# Patient Record
Sex: Male | Born: 1995 | Hispanic: Yes | Marital: Single | Smoking: Current every day smoker
Health system: Southern US, Community
[De-identification: ages and names within clinical notes are randomized; demographics above are authoritative.]

## PROBLEM LIST (undated history)

## (undated) DIAGNOSIS — J45909 Unspecified asthma, uncomplicated: Secondary | ICD-10-CM

---

## 2017-01-29 ENCOUNTER — Encounter: Payer: Self-pay | Admitting: Emergency Medicine

## 2017-01-29 ENCOUNTER — Emergency Department
Admission: EM | Admit: 2017-01-29 | Discharge: 2017-01-29 | Disposition: A | Discharge status: Home / Self Care | Payer: Self-pay | Attending: Emergency Medicine | Admitting: Emergency Medicine

## 2017-01-29 ENCOUNTER — Emergency Department: Payer: Self-pay

## 2017-01-29 DIAGNOSIS — Y999 Unspecified external cause status: Secondary | ICD-10-CM | POA: Insufficient documentation

## 2017-01-29 DIAGNOSIS — Y939 Activity, unspecified: Secondary | ICD-10-CM | POA: Insufficient documentation

## 2017-01-29 DIAGNOSIS — J45909 Unspecified asthma, uncomplicated: Secondary | ICD-10-CM | POA: Insufficient documentation

## 2017-01-29 DIAGNOSIS — R1084 Generalized abdominal pain: Secondary | ICD-10-CM | POA: Insufficient documentation

## 2017-01-29 DIAGNOSIS — F17210 Nicotine dependence, cigarettes, uncomplicated: Secondary | ICD-10-CM | POA: Insufficient documentation

## 2017-01-29 DIAGNOSIS — Y929 Unspecified place or not applicable: Secondary | ICD-10-CM | POA: Insufficient documentation

## 2017-01-29 HISTORY — DX: Unspecified asthma, uncomplicated: J45.909

## 2017-01-29 LAB — CBC WITH DIFFERENTIAL/PLATELET
BASOS PCT: 1 %
Basophils Absolute: 0.1 10*3/uL (ref 0–0.1)
Eosinophils Absolute: 0.1 10*3/uL (ref 0–0.7)
Eosinophils Relative: 1 %
HEMATOCRIT: 49.2 % (ref 40.0–52.0)
HEMOGLOBIN: 16.6 g/dL (ref 13.0–18.0)
LYMPHS ABS: 4 10*3/uL — AB (ref 1.0–3.6)
Lymphocytes Relative: 26 %
MCH: 28.6 pg (ref 26.0–34.0)
MCHC: 33.9 g/dL (ref 32.0–36.0)
MCV: 84.6 fL (ref 80.0–100.0)
MONO ABS: 1.2 10*3/uL — AB (ref 0.2–1.0)
MONOS PCT: 8 %
NEUTROS ABS: 10 10*3/uL — AB (ref 1.4–6.5)
Neutrophils Relative %: 64 %
Platelets: 286 10*3/uL (ref 150–440)
RBC: 5.81 MIL/uL (ref 4.40–5.90)
RDW: 12.7 % (ref 11.5–14.5)
WBC: 15.4 10*3/uL — ABNORMAL HIGH (ref 3.8–10.6)

## 2017-01-29 LAB — COMPREHENSIVE METABOLIC PANEL
ALBUMIN: 5.3 g/dL — AB (ref 3.5–5.0)
ALK PHOS: 70 U/L (ref 38–126)
ALT: 19 U/L (ref 17–63)
ANION GAP: 10 (ref 5–15)
AST: 27 U/L (ref 15–41)
BUN: 9 mg/dL (ref 6–20)
CALCIUM: 9.8 mg/dL (ref 8.9–10.3)
CHLORIDE: 104 mmol/L (ref 101–111)
CO2: 25 mmol/L (ref 22–32)
Creatinine, Ser: 0.81 mg/dL (ref 0.61–1.24)
GFR calc Af Amer: >60 mL/min (ref >60)
GFR calc non Af Amer: >60 mL/min (ref >60)
GLUCOSE: 100 mg/dL — AB (ref 65–99)
Potassium: 3.4 mmol/L — ABNORMAL LOW (ref 3.5–5.1)
SODIUM: 139 mmol/L (ref 135–145)
Total Bilirubin: 0.9 mg/dL (ref 0.3–1.2)
Total Protein: 8.2 g/dL — ABNORMAL HIGH (ref 6.5–8.1)

## 2017-01-29 LAB — URINALYSIS, COMPLETE (UACMP) WITH MICROSCOPIC
BILIRUBIN URINE: NEGATIVE
Bacteria, UA: NONE SEEN
Glucose, UA: NEGATIVE mg/dL
Hgb urine dipstick: NEGATIVE
Ketones, ur: NEGATIVE mg/dL
Leukocytes, UA: NEGATIVE
Nitrite: NEGATIVE
PROTEIN: NEGATIVE mg/dL
RBC / HPF: NONE SEEN RBC/hpf (ref 0–5)
SPECIFIC GRAVITY, URINE: 1.008 (ref 1.005–1.030)
SQUAMOUS EPITHELIAL / LPF: NONE SEEN
pH: 6 (ref 5.0–8.0)

## 2017-01-29 MED ORDER — ACETAMINOPHEN 325 MG PO TABS
650.0000 mg | ORAL_TABLET | Freq: Once | ORAL | Status: AC
Start: 2017-01-29 — End: 2017-01-29
  Administered 2017-01-29: 650 mg via ORAL
  Filled 2017-01-29: qty 2

## 2017-01-29 MED ORDER — IOPAMIDOL (ISOVUE-300) INJECTION 61%
100.0000 mL | Freq: Once | INTRAVENOUS | Status: DC | PRN
  Filled 2017-01-29: qty 100
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
## 2017-01-29 NOTE — Discharge Instructions (Signed)
CT abdominal scan unremarkable for acute injury or findings.  You may take Tylenol or ibuprofen for pain as needed.
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
